# Patient Record
Sex: Female | Born: 1935 | Race: White | Hispanic: No | Smoking: Former smoker
Health system: Southern US, Community
[De-identification: ages and names within clinical notes are randomized; demographics above are authoritative.]

## PROBLEM LIST (undated history)

## (undated) DIAGNOSIS — F0390 Unspecified dementia without behavioral disturbance: Secondary | ICD-10-CM

## (undated) DIAGNOSIS — C801 Malignant (primary) neoplasm, unspecified: Secondary | ICD-10-CM

## (undated) HISTORY — PX: LUNG LOBECTOMY: SHX167

---

## 2015-12-13 ENCOUNTER — Other Ambulatory Visit: Payer: Self-pay | Admitting: Family Medicine

## 2015-12-13 DIAGNOSIS — M818 Other osteoporosis without current pathological fracture: Secondary | ICD-10-CM

## 2015-12-17 ENCOUNTER — Other Ambulatory Visit: Payer: Self-pay | Admitting: Family Medicine

## 2015-12-17 DIAGNOSIS — E2839 Other primary ovarian failure: Secondary | ICD-10-CM

## 2015-12-25 ENCOUNTER — Ambulatory Visit
Admission: RE | Admit: 2015-12-25 | Discharge: 2015-12-25 | Disposition: A | Discharge status: Home / Self Care | Payer: Medicare Other | Source: Ambulatory Visit | Attending: Family Medicine | Admitting: Family Medicine

## 2015-12-25 DIAGNOSIS — M818 Other osteoporosis without current pathological fracture: Secondary | ICD-10-CM

## 2022-01-18 ENCOUNTER — Emergency Department (HOSPITAL_COMMUNITY)
Admission: EM | Admit: 2022-01-18 | Discharge: 2022-01-18 | Discharge status: Against Medical Advice | Payer: Medicare Other | Attending: Emergency Medicine | Admitting: Emergency Medicine

## 2022-01-18 ENCOUNTER — Emergency Department (HOSPITAL_COMMUNITY): Payer: Medicare Other

## 2022-01-18 ENCOUNTER — Other Ambulatory Visit: Payer: Self-pay

## 2022-01-18 ENCOUNTER — Encounter (HOSPITAL_COMMUNITY): Payer: Self-pay

## 2022-01-18 DIAGNOSIS — R519 Headache, unspecified: Secondary | ICD-10-CM | POA: Diagnosis not present

## 2022-01-18 DIAGNOSIS — R059 Cough, unspecified: Secondary | ICD-10-CM | POA: Diagnosis not present

## 2022-01-18 DIAGNOSIS — Z5321 Procedure and treatment not carried out due to patient leaving prior to being seen by health care provider: Secondary | ICD-10-CM | POA: Insufficient documentation

## 2022-01-18 HISTORY — DX: Malignant (primary) neoplasm, unspecified: C80.1

## 2022-01-18 HISTORY — DX: Unspecified dementia, unspecified severity, without behavioral disturbance, psychotic disturbance, mood disturbance, and anxiety: F03.90

## 2022-01-18 NOTE — ED Triage Notes (Addendum)
Patient's daughter reports that the patient has had a cough and chest congestion x 1 week. Patient was seen at [facility] x 2. Patient has a productive cough with light yellow sputum.  Patient added that she also has a headache.

## 2022-01-18 NOTE — ED Notes (Signed)
Pt daughter refused all care. Wanted IV fluids for pt, and inquired about what they would be charged for. Pt's daughter told they needed to wait for a provider, and cost of services received is something the RN is unable to provide. Pt's daughter then took pt out of ED in wheelchair.

## 2022-07-27 ENCOUNTER — Ambulatory Visit (INDEPENDENT_AMBULATORY_CARE_PROVIDER_SITE_OTHER): Payer: Medicare Other | Admitting: Podiatry

## 2022-07-27 ENCOUNTER — Encounter: Payer: Self-pay | Admitting: Podiatry

## 2022-07-27 DIAGNOSIS — M79675 Pain in left toe(s): Secondary | ICD-10-CM

## 2022-07-27 DIAGNOSIS — M79674 Pain in right toe(s): Secondary | ICD-10-CM

## 2022-07-27 DIAGNOSIS — B351 Tinea unguium: Secondary | ICD-10-CM

## 2022-07-27 NOTE — Progress Notes (Signed)
   Chief Complaint  Patient presents with   Nail Problem    Hallux bilateral - thick and discolored nails, hard to cut, shoes uncomfortable   New Patient (Initial Visit)    SUBJECTIVE Patient presents to office today complaining of elongated, thickened nails that cause pain while ambulating in shoes.  Patient is unable to trim their own nails. Patient is here for further evaluation and treatment.  Past Medical History:  Diagnosis Date   Cancer (HCC)    Dementia (HCC)     Allergies  Allergen Reactions   Penicillins      OBJECTIVE General Patient is awake, alert, and oriented x 3 and in no acute distress. Derm Skin is dry and supple bilateral. Negative open lesions or macerations. Remaining integument unremarkable. Nails are tender, long, thickened and dystrophic with subungual debris, consistent with onychomycosis, 1-5 bilateral. No signs of infection noted. Vasc  DP and PT pedal pulses palpable bilaterally. Temperature gradient within normal limits.  Neuro Epicritic and protective threshold sensation grossly intact bilaterally.  Musculoskeletal Exam No symptomatic pedal deformities noted bilateral. Muscular strength within normal limits.  ASSESSMENT 1.  Pain due to onychomycosis of toenails both  PLAN OF CARE 1. Patient evaluated today.  2. Instructed to maintain good pedal hygiene and foot care.  3. Mechanical debridement of nails 1-5 bilaterally performed using a nail nipper. Filed with dremel without incident.  4. Return to clinic in 3 mos.    Felecia Shelling, DPM [facility]  Dr. Felecia Shelling, DPM    [address] [place], [place] [address]                Office [phone]  Fax [phone]

## 2022-10-28 ENCOUNTER — Ambulatory Visit: Payer: Medicare Other | Admitting: Podiatry
